# Patient Record
Sex: Female | Born: 1975 | Race: Asian | Hispanic: No | Marital: Married | Smoking: Never smoker
Health system: Southern US, Community
[De-identification: ages and names within clinical notes are randomized; demographics above are authoritative.]

---

## 2013-08-05 ENCOUNTER — Encounter (HOSPITAL_COMMUNITY): Payer: Self-pay | Admitting: Emergency Medicine

## 2013-08-05 ENCOUNTER — Emergency Department (HOSPITAL_COMMUNITY)
Admission: EM | Admit: 2013-08-05 | Discharge: 2013-08-05 | Disposition: A | Discharge status: Home / Self Care | Payer: Medicaid Other | Source: Home / Self Care | Attending: Family Medicine | Admitting: Family Medicine

## 2013-08-05 DIAGNOSIS — R109 Unspecified abdominal pain: Secondary | ICD-10-CM

## 2013-08-05 LAB — POCT URINALYSIS DIP (DEVICE)
BILIRUBIN URINE: NEGATIVE
Glucose, UA: NEGATIVE mg/dL
Ketones, ur: NEGATIVE mg/dL
LEUKOCYTES UA: NEGATIVE
Nitrite: NEGATIVE
Protein, ur: NEGATIVE mg/dL
SPECIFIC GRAVITY, URINE: 1.015 (ref 1.005–1.030)
Urobilinogen, UA: 0.2 mg/dL (ref 0.0–1.0)
pH: >=9 (ref 5.0–8.0)

## 2013-08-05 LAB — POCT PREGNANCY, URINE: PREG TEST UR: NEGATIVE

## 2013-08-05 NOTE — ED Provider Notes (Signed)
CSN: [id number]     Arrival date & time 08/05/13  1242 History   First MD Initiated Contact with Patient 08/05/13 1515     Chief Complaint  Patient presents with  . Abdominal Pain   (Consider location/radiation/quality/duration/timing/severity/associated sxs/prior Treatment) Patient is a 38 y.o. female presenting with abdominal pain. The history is provided by the patient.  Abdominal Pain Pain location:  LLQ Pain quality: sharp   Pain radiates to:  Does not radiate Pain severity:  Mild Duration:  4 days Chronicity:  New Context comment:  Has iud and menses irreg since. no n,v,d or fever. Associated symptoms: vaginal bleeding   Associated symptoms: no constipation, no diarrhea, no fever, no nausea, no vaginal discharge and no vomiting     History reviewed. No pertinent past medical history. History reviewed. No pertinent past surgical history. History reviewed. No pertinent family history. History  Substance Use Topics  . Smoking status: Never Smoker   . Smokeless tobacco: Not on file  . Alcohol Use: No   OB History   Grav Para Term Preterm Abortions TAB SAB Ect Mult Living                 Review of Systems  Constitutional: Negative.  Negative for fever.  Gastrointestinal: Positive for abdominal pain. Negative for nausea, vomiting, diarrhea and constipation.  Genitourinary: Positive for vaginal bleeding. Negative for vaginal discharge.    Allergies  Review of patient's allergies indicates no known allergies.  Home Medications  No current outpatient prescriptions on file. BP 115/72  Pulse 76  Temp(Src) 99.2 F (37.3 C) (Oral)  Resp 20  SpO2 100% Physical Exam  Nursing note and vitals reviewed. Constitutional: She is oriented to person, place, and time. She appears well-developed and well-nourished.  Abdominal: Soft. Bowel sounds are normal. She exhibits no distension and no mass. There is no hepatosplenomegaly. There is tenderness in the left lower quadrant. There  is no rebound, no guarding and no CVA tenderness.    Neurological: She is alert and oriented to person, place, and time.  Skin: Skin is warm and dry.    ED Course  Procedures (including critical care time) Labs Review Labs Reviewed  POCT URINALYSIS DIP (DEVICE) - Abnormal; Notable for the following:    Hgb urine dipstick LARGE (*)    All other components within normal limits  POCT PREGNANCY, URINE   Imaging Review No results found.  EKG Interpretation    Date/Time:    Ventricular Rate:    PR Interval:    QRS Duration:   QT Interval:    QTC Calculation:   R Axis:     Text Interpretation:              MDM      Linna HoffJames D Jinna Weinman, MD 08/05/13 [phone]

## 2013-08-05 NOTE — Discharge Instructions (Signed)
[facility] as desired.

## 2013-08-05 NOTE — ED Notes (Signed)
Pt  Reports  l  Sided  abd  Pain   X     4  Days     With  Frequent  Urination          Pt  States   Wants  Her UIUD  CHECKED   SHE  REPORTS  SHE HAD IT PUT IN IN [place]           SHE  AMBULATED  TO ROOM  WITH A  STEADY  FLUID  GAIT

## 2013-08-13 ENCOUNTER — Encounter (HOSPITAL_COMMUNITY): Payer: Self-pay

## 2013-08-13 ENCOUNTER — Inpatient Hospital Stay (HOSPITAL_COMMUNITY): Payer: Medicaid Other

## 2013-08-13 ENCOUNTER — Inpatient Hospital Stay (HOSPITAL_COMMUNITY)
Admission: AD | Admit: 2013-08-13 | Discharge: 2013-08-13 | Disposition: A | Discharge status: Home / Self Care | Payer: Medicaid Other | Source: Ambulatory Visit | Attending: Obstetrics & Gynecology | Admitting: Obstetrics & Gynecology

## 2013-08-13 DIAGNOSIS — N925 Other specified irregular menstruation: Secondary | ICD-10-CM

## 2013-08-13 DIAGNOSIS — Z30432 Encounter for removal of intrauterine contraceptive device: Secondary | ICD-10-CM

## 2013-08-13 DIAGNOSIS — T8332XA Displacement of intrauterine contraceptive device, initial encounter: Secondary | ICD-10-CM

## 2013-08-13 DIAGNOSIS — N949 Unspecified condition associated with female genital organs and menstrual cycle: Secondary | ICD-10-CM

## 2013-08-13 DIAGNOSIS — T8339XA Other mechanical complication of intrauterine contraceptive device, initial encounter: Secondary | ICD-10-CM | POA: Insufficient documentation

## 2013-08-13 DIAGNOSIS — R1032 Left lower quadrant pain: Secondary | ICD-10-CM | POA: Insufficient documentation

## 2013-08-13 DIAGNOSIS — T8389XA Other specified complication of genitourinary prosthetic devices, implants and grafts, initial encounter: Secondary | ICD-10-CM

## 2013-08-13 LAB — URINALYSIS, ROUTINE W REFLEX MICROSCOPIC
Bilirubin Urine: NEGATIVE
GLUCOSE, UA: NEGATIVE mg/dL
KETONES UR: NEGATIVE mg/dL
LEUKOCYTES UA: NEGATIVE
NITRITE: NEGATIVE
PH: 5.5 (ref 5.0–8.0)
Protein, ur: NEGATIVE mg/dL
SPECIFIC GRAVITY, URINE: 1.025 (ref 1.005–1.030)
Urobilinogen, UA: 0.2 mg/dL (ref 0.0–1.0)

## 2013-08-13 LAB — URINE MICROSCOPIC-ADD ON

## 2013-08-13 LAB — COMPREHENSIVE METABOLIC PANEL
ALK PHOS: 72 U/L (ref 39–117)
ALT: 10 U/L (ref 0–35)
AST: 15 U/L (ref 0–37)
Albumin: 4.1 g/dL (ref 3.5–5.2)
BILIRUBIN TOTAL: 0.3 mg/dL (ref 0.3–1.2)
BUN: 7 mg/dL (ref 6–23)
CO2: 26 mEq/L (ref 19–32)
Calcium: 9.1 mg/dL (ref 8.4–10.5)
Chloride: 102 mEq/L (ref 96–112)
Creatinine, Ser: 0.63 mg/dL (ref 0.50–1.10)
GFR calc non Af Amer: >90 mL/min (ref >90)
GLUCOSE: 129 mg/dL — AB (ref 70–99)
POTASSIUM: 4.5 meq/L (ref 3.7–5.3)
Sodium: 139 mEq/L (ref 137–147)
TOTAL PROTEIN: 7.6 g/dL (ref 6.0–8.3)

## 2013-08-13 LAB — CBC
HCT: 38.1 % (ref 36.0–46.0)
HEMOGLOBIN: 12.9 g/dL (ref 12.0–15.0)
MCH: 29.3 pg (ref 26.0–34.0)
MCHC: 33.9 g/dL (ref 30.0–36.0)
MCV: 86.4 fL (ref 78.0–100.0)
Platelets: 221 10*3/uL (ref 150–400)
RBC: 4.41 MIL/uL (ref 3.87–5.11)
RDW: 13.3 % (ref 11.5–15.5)
WBC: 10.7 10*3/uL — ABNORMAL HIGH (ref 4.0–10.5)

## 2013-08-13 LAB — WET PREP, GENITAL
Clue Cells Wet Prep HPF POC: NONE SEEN
Trich, Wet Prep: NONE SEEN
YEAST WET PREP: NONE SEEN

## 2013-08-13 LAB — POCT PREGNANCY, URINE: Preg Test, Ur: NEGATIVE

## 2013-08-13 MED ORDER — NORGESTIMATE-ETH ESTRADIOL 0.25-35 MG-MCG PO TABS: 1.0000 | ORAL_TABLET | Freq: Every day | ORAL | Status: DC

## 2013-08-13 NOTE — Discharge Instructions (Signed)
Ethinyl Estradiol; Norgestimate tablets What is this medicine? ETHINYL ESTRADIOL; NORGESTIMATE (ETH in il es tra DYE ole; nor JES ti mate) is an oral contraceptive. The products combine two types of female hormones, an estrogen and a progestin. They are used to prevent ovulation and pregnancy. Some products are also used to treat acne in females. This medicine may be used for other purposes; ask your health care provider or pharmacist if you have questions. COMMON BRAND NAME(S): Estarylla, MONO-LINYAH, MonoNessa, Ortho Tri-Cyclen Lo, Ortho Tri-Cyclen, Ortho-Cyclen, Previfem , Sprintec, Tri-Estarylla, TRI-LINYAH, Tri-Lo-Sprintec , Tri-Previfem , Tri-Sprintec , Trinessa What should I tell my health care provider before I take this medicine? They need to know if you have or ever had any of these conditions: -abnormal vaginal bleeding -blood vessel disease or blood clots -breast, cervical, endometrial, ovarian, liver, or uterine cancer -diabetes -gallbladder disease -heart disease or recent heart attack -high blood pressure -high cholesterol -kidney disease -liver disease -migraine headaches -stroke -systemic lupus erythematosus (SLE) -tobacco smoker -an unusual or allergic reaction to estrogens, progestins, other medicines, foods, dyes, or preservatives -pregnant or trying to get pregnant -breast-feeding How should I use this medicine? Take this medicine by mouth. To reduce nausea, this medicine may be taken with food. Follow the directions on the prescription label. Take this medicine at the same time each day and in the order directed on the package. Do not take your medicine more often than directed. Contact your pediatrician regarding the use of this medicine in children. Special care may be needed. This medicine has been used in female children who have started having menstrual periods. A patient package insert for the product will be given with each prescription and refill. Read this  sheet carefully each time. The sheet may change frequently. Overdosage: If you think you have taken too much of this medicine contact a poison control center or emergency room at once. NOTE: This medicine is only for you. Do not share this medicine with others. What if I miss a dose? If you miss a dose, refer to the patient information sheet you received with your medicine for direction. If you miss more than one pill, this medicine may not be as effective and you may need to use another form of birth control. What may interact with this medicine? -acetaminophen -antibiotics or medicines for infections, especially rifampin, rifabutin, rifapentine, and griseofulvin, and possibly penicillins or tetracyclines -aprepitant -ascorbic acid (vitamin C) -atorvastatin -barbiturate medicines, such as phenobarbital -bosentan -carbamazepine -caffeine -clofibrate -cyclosporine -dantrolene -doxercalciferol -felbamate -grapefruit juice -hydrocortisone -medicines for anxiety or sleeping problems, such as diazepam or temazepam -medicines for diabetes, including pioglitazone -mineral oil -modafinil -mycophenolate -nefazodone -oxcarbazepine -phenytoin -prednisolone -ritonavir or other medicines for HIV infection or AIDS -rosuvastatin -selegiline -soy isoflavones supplements -St. John's wort -tamoxifen or raloxifene -theophylline -thyroid hormones -topiramate -warfarin This list may not describe all possible interactions. Give your health care provider a list of all the medicines, herbs, non-prescription drugs, or dietary supplements you use. Also tell them if you smoke, drink alcohol, or use illegal drugs. Some items may interact with your medicine. What should I watch for while using this medicine? Visit your doctor or health care professional for regular checks on your progress. You will need a regular breast and pelvic exam and Pap smear while on this medicine. You should also discuss the  need for regular mammograms with your health care professional, and follow his or her guidelines for these tests. This medicine can make your body retain fluid, making your   fingers, hands, or ankles swell. Your blood pressure can go up. Contact your doctor or health care professional if you feel you are retaining fluid. Use an additional method of contraception during the first cycle that you take these tablets. If you have any reason to think you are pregnant, stop taking this medicine right away and contact your doctor or health care professional. If you are taking this medicine for hormone related problems, it may take several cycles of use to see improvement in your condition. Smoking increases the risk of getting a blood clot or having a stroke while you are taking birth control pills, especially if you are more than 37 years old. You are strongly advised not to smoke. This medicine can make you more sensitive to the sun. Keep out of the sun. If you cannot avoid being in the sun, wear protective clothing and use sunscreen. Do not use sun lamps or tanning beds/booths. If you wear contact lenses and notice visual changes, or if the lenses begin to feel uncomfortable, consult your eye care specialist. In some women, tenderness, swelling, or minor bleeding of the gums may occur. Notify your dentist if this happens. Brushing and flossing your teeth regularly may help limit this. See your dentist regularly and inform your dentist of the medicines you are taking. If you are going to have elective surgery, you may need to stop taking this medicine before the surgery. Consult your health care professional for advice. This medicine does not protect you against HIV infection (AIDS) or any other sexually transmitted diseases. What side effects may I notice from receiving this medicine? Side effects that you should report to your doctor or health care professional as soon as possible: -breast tissue changes or  discharge -changes in vaginal bleeding during your period or between your periods -chest pain -coughing up blood -dizziness or fainting spells -headaches or migraines -leg, arm or groin pain -severe or sudden headaches -stomach pain (severe) -sudden shortness of breath -sudden loss of coordination, especially on one side of the body -speech problems -symptoms of vaginal infection like itching, irritation or unusual discharge -tenderness in the upper abdomen -vomiting -weakness or numbness in the arms or legs, especially on one side of the body -yellowing of the eyes or skin Side effects that usually do not require medical attention (report to your doctor or health care professional if they continue or are bothersome): -breakthrough bleeding and spotting that continues beyond the 3 initial cycles of pills -breast tenderness -mood changes, anxiety, depression, frustration, anger, or emotional outbursts -increased sensitivity to sun or ultraviolet light -nausea -skin rash, acne, or brown spots on the skin -weight gain (slight) This list may not describe all possible side effects. Call your doctor for medical advice about side effects. You may report side effects to FDA at 1-800-FDA-1088. Where should I keep my medicine? Keep out of the reach of children. Store at room temperature between 15 and 30 degrees C (59 and 86 degrees F). Throw away any unused medicine after the expiration date. NOTE: This sheet is a summary. It may not cover all possible information. If you have questions about this medicine, talk to your doctor, pharmacist, or health care provider.  2014, Elsevier/Gold Standard. (2008-07-05 13:40:47)  

## 2013-08-13 NOTE — MAU Note (Signed)
Pt presents with complaints of lower abdominal pain and states that it is because of her IUD. She states she is new to the area and was told to come here and wants to have it removed

## 2013-08-13 NOTE — MAU Provider Note (Signed)
History     CSN: [id number]  Arrival date and time: 08/13/13 1134   None     Chief Complaint  Patient presents with  . IUD removal    HPI  Kelli Mercado is a 38 yo G2P2002 who presents to the MAU today for abdominal pain from her IUD.   Pt moved from [place] 5 months ago and has not established care here. In 2013, she had a mirena IUD placed.  Since then she has had irregular spotting. Otherwise she has had no complications with the IUD. The spotting has very much annoyed her from time to time. Sometimes lasts for 15-20 days at a time.    However, last week started having LLQ Pain. Has been constant since last week.  At its worst the pain was 8/10 and now it is more like a 5/10. Feels like she has to slouch forward in order to keep it from hurting. Now doesn't feel that bad. Initially had loose stools but now resolved.  Has no appetite. Never had pain like this before.   No fevers, chills, diarrhea, constipation, vomiting. Some dysuria. No hematuria, melena, hematochezia.     OB History   Grav Para Term Preterm Abortions TAB SAB Ect Mult Living   2 2              History reviewed. No pertinent past medical history.  History reviewed. No pertinent past surgical history.  History reviewed. No pertinent family history.  History  Substance Use Topics  . Smoking status: Never Smoker   . Smokeless tobacco: Not on file  . Alcohol Use: Not on file    Allergies: No Known Allergies  No prescriptions prior to admission    ROS Physical Exam   Blood pressure 101/86, pulse 89, temperature 98.2 F (36.8 C), resp. rate 18, height 5\' 1"  (1.549 m), weight 55.339 kg (122 lb), last menstrual period 08/13/2013.  Physical Exam  Constitutional: She is oriented to person, place, and time. She appears well-developed and well-nourished.  HENT:  Head: Normocephalic and atraumatic.  Eyes: Conjunctivae are normal.  Neck: Neck supple.  Cardiovascular: Normal rate, regular rhythm  and normal heart sounds.   Respiratory: Breath sounds normal.  GI: Soft. Bowel sounds are normal. She exhibits no distension. There is tenderness (mild with deep palpation of LLQ). There is no rebound and no guarding.  Genitourinary:  NEFG Vagina normal Cervix normal appearing with IUD strings out but somewhat long No vaginal bleeding or discharge Tender with palpation of the uterus. Feels to be retroverted.   Adnexa normal  Musculoskeletal: Normal range of motion.  Neurological: She is alert and oriented to person, place, and time.  Skin: Skin is warm and dry.  Psychiatric: She has a normal mood and affect.    MAU Course  Procedures  MDM - cbc, cmp    US:  FINDINGS: Uterus  Measurements: 9.1 x 4.7 x 4.6 cm. Retroverted and rotated. No myometrial abnormalities.  Endometrium  Thickness: 5.2 mm. There is an IUD abnormally positioned. The arms are in the left aspect of the lower uterine segment and the body is in the cervix.  Right ovary  Measurements: 2.8 x 2.0 x 2.4 cm. No cysts or masses.  Left ovary  Measurements: 3.6 x 2.0 x 2.5 cm. Slightly collapsed simple appearing cyst measuring 2.8 x 1.4 x 2.3 cm.  Other findings  No free fluid.  IMPRESSION: 1. Malpositioned IUD as described above. 2. No myometrial abnormalities and normal endometrial thickness.  3. Normal ovaries.   Assessment and Plan   38 yo G2P2002 here for LLQ abd pain.   - pain mild on exam. CBC and CMP unremarkable - on exam IUD strings in place but US showing IUD in the cervix with arms in the left side. Position of IUD consistent with pt's pain symptoms and location and likely cause.  - after thorough consent, IUD removal performed as below due to malposition.  - started pt on OCPs as pt adamant she doesn't want more children - note sent to GYN clinic for establishment of GYN care and longer discussion about birth control as pt wants to be seen here - return precautions discussed including  worsening of her symptoms or change from baseline.    IUD Removal  Patient was in the dorsal lithotomy position, normal external genitalia was noted.  A speculum was placed in the patient's vagina, normal discharge was noted, no lesions. The multiparous cervix was visualized, no lesions, no abnormal discharge.  The strings of the IUD were grasped and pulled using ring forceps. Patient tolerated the procedure well.      Quanell Loughney L 08/13/2013, 12:01 PM

## 2013-08-14 LAB — URINE CULTURE

## 2013-08-14 LAB — GC/CHLAMYDIA PROBE AMP
CT Probe RNA: NEGATIVE
GC Probe RNA: NEGATIVE

## 2013-09-22 ENCOUNTER — Encounter: Payer: Self-pay | Admitting: Family Medicine

## 2013-09-22 ENCOUNTER — Encounter (HOSPITAL_COMMUNITY): Payer: Self-pay | Admitting: Emergency Medicine

## 2013-09-22 ENCOUNTER — Ambulatory Visit (INDEPENDENT_AMBULATORY_CARE_PROVIDER_SITE_OTHER): Payer: Medicaid Other | Admitting: Family Medicine

## 2013-09-22 VITALS — BP 142/83 | HR 71 | Temp 97.4°F | Ht 61.0 in | Wt 125.0 lb

## 2013-09-22 DIAGNOSIS — Z01812 Encounter for preprocedural laboratory examination: Secondary | ICD-10-CM

## 2013-09-22 DIAGNOSIS — Z309 Encounter for contraceptive management, unspecified: Secondary | ICD-10-CM

## 2013-09-22 DIAGNOSIS — Z30017 Encounter for initial prescription of implantable subdermal contraceptive: Secondary | ICD-10-CM

## 2013-09-22 DIAGNOSIS — Z975 Presence of (intrauterine) contraceptive device: Secondary | ICD-10-CM | POA: Insufficient documentation

## 2013-09-22 LAB — POCT PREGNANCY, URINE
Preg Test, Ur: NEGATIVE
Preg Test, Ur: NEGATIVE

## 2013-09-22 MED ORDER — ETONOGESTREL 68 MG ~~LOC~~ IMPL
68.0000 mg | DRUG_IMPLANT | Freq: Once | SUBCUTANEOUS | Status: AC
  Administered 2013-09-22: 68 mg via SUBCUTANEOUS

## 2013-09-22 MED ORDER — ETONOGESTREL 68 MG ~~LOC~~ IMPL: 1.0000 | DRUG_IMPLANT | Freq: Once | SUBCUTANEOUS | Status: DC

## 2013-09-22 NOTE — Progress Notes (Signed)
Pt with neg Urine Pregnancy test. Recently d/c'd mirena for abdominal pain that has since resolved. Pt now wants nexplanon. Tawana ScaleMichael Ryan Harly Pipkins, MD OB Fellow   Procedure: Insertion of Nexplanon Consent: The risks and benefits of the procedure were discussed with the patient. The alternatives were explained to the patient. The disadvantages to not doing the procedure were discussed with the patent along with the disadvantages of the medication.  The written consent form has been signed and placed in the patient's medical record The patient voiced understanding of the procedure and agreed to proceed. Indication: Contraception Physicians: Dr. Jolyn LentMichael Moreen Piggott Description in detail: A timeout was completed before the start of the procedure - the site was  verified and documented in the chart. HCG was negative prior to starting. The inner L arm was cleansed with alcohol and anesthetized with 3cc Lidocaine.  The Nexplanon introducer was placed in the appropriate location and inserted in the usual fashion. The patient tolerated the procedure well, without any S/S of vasovagal responses. EBL: Minimal, <501ml Complications: None Instructions to patient: Return to clinic vs. ER, depending on severity, with fevers/chills/excessive bleeding from surgical site.  Keep pressure dressing in place for 24 hours.  Expect mild soreness for one day. Call clinic with other questions. Allow approximately two weeks before having unprotected intercourse; the Nexplanon does not protect against STIs. Replace in 3 years. Follow up plan: Return to clinic for follow up as needed.

## 2013-09-22 NOTE — Patient Instructions (Signed)
Etonogestrel implant What is this medicine? ETONOGESTREL (et oh noe JES trel) is a contraceptive (birth control) device. It is used to prevent pregnancy. It can be used for up to 3 years. This medicine may be used for other purposes; ask your health care provider or pharmacist if you have questions. COMMON BRAND NAME(S): Implanon, Nexplanon  What should I tell my health care provider before I take this medicine? They need to know if you have any of these conditions: -abnormal vaginal bleeding -blood vessel disease or blood clots -cancer of the breast, cervix, or liver -depression -diabetes -gallbladder disease -headaches -heart disease or recent heart attack -high blood pressure -high cholesterol -kidney disease -liver disease -renal disease -seizures -tobacco smoker -an unusual or allergic reaction to etonogestrel, other hormones, anesthetics or antiseptics, medicines, foods, dyes, or preservatives -pregnant or trying to get pregnant -breast-feeding How should I use this medicine? This device is inserted just under the skin on the inner side of your upper arm by a health care professional. Talk to your pediatrician regarding the use of this medicine in children. Special care may be needed. Overdosage: If you think you've taken too much of this medicine contact a poison control center or emergency room at once. Overdosage: If you think you have taken too much of this medicine contact a poison control center or emergency room at once. NOTE: This medicine is only for you. Do not share this medicine with others. What if I miss a dose? This does not apply. What may interact with this medicine? Do not take this medicine with any of the following medications: -amprenavir -bosentan -fosamprenavir This medicine may also interact with the following medications: -barbiturate medicines for inducing sleep or treating seizures -certain medicines for fungal infections like ketoconazole and  itraconazole -griseofulvin -medicines to treat seizures like carbamazepine, felbamate, oxcarbazepine, phenytoin, topiramate -modafinil -phenylbutazone -rifampin -some medicines to treat HIV infection like atazanavir, indinavir, lopinavir, nelfinavir, tipranavir, ritonavir -St. John's wort This list may not describe all possible interactions. Give your health care provider a list of all the medicines, herbs, non-prescription drugs, or dietary supplements you use. Also tell them if you smoke, drink alcohol, or use illegal drugs. Some items may interact with your medicine. What should I watch for while using this medicine? This product does not protect you against HIV infection (AIDS) or other sexually transmitted diseases. You should be able to feel the implant by pressing your fingertips over the skin where it was inserted. Tell your doctor if you cannot feel the implant. What side effects may I notice from receiving this medicine? Side effects that you should report to your doctor or health care professional as soon as possible: -allergic reactions like skin rash, itching or hives, swelling of the face, lips, or tongue -breast lumps -changes in vision -confusion, trouble speaking or understanding -dark urine -depressed mood -general ill feeling or flu-like symptoms -light-colored stools -loss of appetite, nausea -right upper belly pain -severe headaches -severe pain, swelling, or tenderness in the abdomen -shortness of breath, chest pain, swelling in a leg -signs of pregnancy -sudden numbness or weakness of the face, arm or leg -trouble walking, dizziness, loss of balance or coordination -unusual vaginal bleeding, discharge -unusually weak or tired -yellowing of the eyes or skin Side effects that usually do not require medical attention (Report these to your doctor or health care professional if they continue or are bothersome.): -acne -breast pain -changes in  weight -cough -fever or chills -headache -irregular menstrual bleeding -itching, burning,   and vaginal discharge -pain or difficulty passing urine -sore throat This list may not describe all possible side effects. Call your doctor for medical advice about side effects. You may report side effects to FDA at 1-800-FDA-1088. Where should I keep my medicine? This drug is given in a hospital or clinic and will not be stored at home. NOTE: This sheet is a summary. It may not cover all possible information. If you have questions about this medicine, talk to your doctor, pharmacist, or health care provider.  2014, Elsevier/Gold Standard. (2012-01-25 15:37:45)  

## 2013-10-23 ENCOUNTER — Encounter: Payer: Self-pay | Admitting: *Deleted

## 2013-11-10 ENCOUNTER — Ambulatory Visit: Payer: Medicaid Other | Admitting: Family Medicine

## 2014-06-04 ENCOUNTER — Encounter: Payer: Self-pay | Admitting: Family Medicine

## 2015-06-11 ENCOUNTER — Ambulatory Visit (HOSPITAL_BASED_OUTPATIENT_CLINIC_OR_DEPARTMENT_OTHER)
Admission: RE | Admit: 2015-06-11 | Discharge: 2015-06-11 | Disposition: A | Discharge status: Home / Self Care | Payer: 59 | Source: Ambulatory Visit | Attending: Family Medicine | Admitting: Family Medicine

## 2015-06-11 ENCOUNTER — Other Ambulatory Visit: Payer: Self-pay | Admitting: Family Medicine

## 2015-06-11 ENCOUNTER — Ambulatory Visit (INDEPENDENT_AMBULATORY_CARE_PROVIDER_SITE_OTHER): Payer: 59

## 2015-06-11 ENCOUNTER — Ambulatory Visit (INDEPENDENT_AMBULATORY_CARE_PROVIDER_SITE_OTHER): Payer: 59 | Admitting: Family Medicine

## 2015-06-11 VITALS — BP 132/78 | HR 81 | Temp 97.8°F | Resp 16 | Ht 62.0 in | Wt 126.6 lb

## 2015-06-11 DIAGNOSIS — R1012 Left upper quadrant pain: Secondary | ICD-10-CM

## 2015-06-11 DIAGNOSIS — K7689 Other specified diseases of liver: Secondary | ICD-10-CM | POA: Diagnosis not present

## 2015-06-11 DIAGNOSIS — R1032 Left lower quadrant pain: Secondary | ICD-10-CM

## 2015-06-11 DIAGNOSIS — R109 Unspecified abdominal pain: Secondary | ICD-10-CM

## 2015-06-11 DIAGNOSIS — M4126 Other idiopathic scoliosis, lumbar region: Secondary | ICD-10-CM | POA: Diagnosis not present

## 2015-06-11 DIAGNOSIS — E049 Nontoxic goiter, unspecified: Secondary | ICD-10-CM | POA: Diagnosis not present

## 2015-06-11 DIAGNOSIS — M62830 Muscle spasm of back: Secondary | ICD-10-CM

## 2015-06-11 DIAGNOSIS — R195 Other fecal abnormalities: Secondary | ICD-10-CM

## 2015-06-11 DIAGNOSIS — M419 Scoliosis, unspecified: Secondary | ICD-10-CM

## 2015-06-11 LAB — COMPREHENSIVE METABOLIC PANEL
ALBUMIN: 4.4 g/dL (ref 3.6–5.1)
ALT: 10 U/L (ref 6–29)
AST: 15 U/L (ref 10–30)
Alkaline Phosphatase: 65 U/L (ref 33–115)
BUN: 5 mg/dL — AB (ref 7–25)
CO2: 26 mmol/L (ref 20–31)
CREATININE: 0.63 mg/dL (ref 0.50–1.10)
Calcium: 9.3 mg/dL (ref 8.6–10.2)
Chloride: 105 mmol/L (ref 98–110)
Glucose, Bld: 88 mg/dL (ref 65–99)
POTASSIUM: 4.1 mmol/L (ref 3.5–5.3)
SODIUM: 139 mmol/L (ref 135–146)
TOTAL PROTEIN: 7.4 g/dL (ref 6.1–8.1)
Total Bilirubin: 0.4 mg/dL (ref 0.2–1.2)

## 2015-06-11 LAB — POCT URINALYSIS DIP (MANUAL ENTRY)
BILIRUBIN UA: NEGATIVE
BILIRUBIN UA: NEGATIVE
Glucose, UA: NEGATIVE
LEUKOCYTES UA: NEGATIVE
NITRITE UA: NEGATIVE
PH UA: 7
Protein Ur, POC: NEGATIVE
Spec Grav, UA: 1.01
Urobilinogen, UA: 0.2

## 2015-06-11 LAB — POCT WET + KOH PREP
Trich by wet prep: ABSENT
YEAST BY KOH: ABSENT
YEAST BY WET PREP: ABSENT

## 2015-06-11 LAB — POCT CBC
Granulocyte percent: 69.5 %G (ref 37–80)
HEMATOCRIT: 38.8 % (ref 37.7–47.9)
HEMOGLOBIN: 13.4 g/dL (ref 12.2–16.2)
Lymph, poc: 2.3 (ref 0.6–3.4)
MCH, POC: 29.1 pg (ref 27–31.2)
MCHC: 34.4 g/dL (ref 31.8–35.4)
MCV: 84.7 fL (ref 80–97)
MID (cbc): 0.6 (ref 0–0.9)
MPV: 8.7 fL (ref 0–99.8)
POC GRANULOCYTE: 6.7 (ref 2–6.9)
POC LYMPH PERCENT: 24.1 %L (ref 10–50)
POC MID %: 6.4 % (ref 0–12)
Platelet Count, POC: 212 10*3/uL (ref 142–424)
RBC: 4.58 M/uL (ref 4.04–5.48)
RDW, POC: 13.6 %
WBC: 9.6 10*3/uL (ref 4.6–10.2)

## 2015-06-11 LAB — HEMOCCULT GUIAC POC 1CARD (OFFICE): Fecal Occult Blood, POC: POSITIVE — AB

## 2015-06-11 LAB — POC MICROSCOPIC URINALYSIS (UMFC): Mucus: ABSENT

## 2015-06-11 LAB — THYROID PANEL WITH TSH
Free Thyroxine Index: 2.2 (ref 1.4–3.8)
T3 Uptake: 28 % (ref 22–35)
T4, Total: 8 ug/dL (ref 4.5–12.0)
TSH: 1.723 u[IU]/mL (ref 0.350–4.500)

## 2015-06-11 LAB — POCT URINE PREGNANCY: Preg Test, Ur: NEGATIVE

## 2015-06-11 MED ORDER — CYCLOBENZAPRINE HCL 10 MG PO TABS: 10.0000 mg | ORAL_TABLET | Freq: Three times a day (TID) | ORAL | Status: AC | PRN

## 2015-06-11 MED ORDER — IOHEXOL 300 MG/ML  SOLN: 100.0000 mL | Freq: Once | INTRAMUSCULAR | Status: DC | PRN

## 2015-06-11 NOTE — Patient Instructions (Addendum)
GO OVER [facility] [address], [place], [place] [id number]. GO INTO THE ER TELL THEM THAT YOU ARE THERE FOR CT SCAN AND TO SEND YOU TO IMAGING.  It is possible this is a very mild diverticulisis vs a severe muscle spasm in your back.

## 2015-06-11 NOTE — Progress Notes (Signed)
Subjective:  This chart was scribed for Norberto Sorenson, MD by Broadus John, Medical Scribe. This patient was seen in Room 1 and the patient's care was started at 9:47 AM.   Patient ID: Kelli Mercado, female    DOB: November 16, 1975, 39 y.o.   MRN: [id number]  Chief Complaint  Patient presents with  . Back Pain    HPI HPI Comments: Kelli Mercado is a 39 y.o. female who presents to [facility] complaining tenderness in the back, onset 2 weeks ago. Pt indicates that the pain is present in her lower abdomen, left flank, left lateral ribs, and entire low lumbar spine when in a bending position. She reports that sitting, and walking exacerbate the worsening pain. Pt also notes symptoms of bloating and distension of the abdomen. Pt states that she took Ibuprofen for the pain, last dosage yesterday, however she finds minimal relief with it. She denies bowel or urinary symptoms, tarry stool, constipation, orchange in appetite. Pt also reports tenderness on the sides of her neck, in the area behind her ears. Pt reports a history of constipation (2 months ago) that has now resolved after visiting a doctor and being prescribed a medication.  Pt states that her last Pap smear was 2 years ago, and notes that that she does have a birth control implant in her arm.     Patient Active Problem List   Diagnosis Date Noted  . Nexplanon in place 09/22/2013   History reviewed. No pertinent past medical history. History reviewed. No pertinent past surgical history. No Known Allergies Prior to Admission medications   Medication Sig Start Date End Date Taking? Authorizing Provider  ibuprofen (ADVIL,MOTRIN) 800 MG tablet Take 800 mg by mouth every 8 (eight) hours as needed.   Yes Historical Provider, MD   Social History   Social History  . Marital Status: Married    Spouse Name: N/A  . Number of Children: N/A  . Years of Education: N/A   Occupational History  . Not on file.    Social History Main Topics  . Smoking status: Never Smoker   . Smokeless tobacco: Never Used  . Alcohol Use: No  . Drug Use: No  . Sexual Activity: Yes   Other Topics Concern  . Not on file   Social History Narrative   ** Merged History Encounter **        Review of Systems  Constitutional: Negative for appetite change.  Gastrointestinal: Positive for abdominal pain. Negative for diarrhea, constipation and blood in stool.  Genitourinary: Positive for flank pain. Negative for dysuria and frequency.  Musculoskeletal: Positive for back pain and neck pain.      Objective:   Physical Exam  Constitutional: She is oriented to person, place, and time. She appears well-developed and well-nourished. No distress.  HENT:  Head: Normocephalic and atraumatic.  Eyes: EOM are normal. Pupils are equal, round, and reactive to light.  Neck: Neck supple.  Question of left lobe fullness in the thyroid.  Cardiovascular: Normal rate.   Pulmonary/Chest: Effort normal.  Abdominal: She exhibits no distension. There is tenderness (LLQ). There is CVA tenderness. There is no rebound and no guarding.  Hypoactive bowel sounds.   Genitourinary: Rectum normal and vagina normal. There is no rash, tenderness, lesion or injury on the right labia. There is no rash, tenderness, lesion or injury on the left labia. Uterus is not deviated, not enlarged, not fixed and not tender. Cervix exhibits no motion tenderness, no  discharge and no friability. Right adnexum displays no mass, no tenderness and no fullness. Left adnexum displays no mass, no tenderness and no fullness.  Musculoskeletal: She exhibits tenderness.  Severe pain with hip flexion. Tenderness to palpation on the left lumbar paraspinal muscles. No significant pain on the paraspinous process.  Neurological: She is alert and oriented to person, place, and time. No cranial nerve deficit.  Reflex Scores:      Patellar reflexes are 2+ on the right side and  2+ on the left side.      Achilles reflexes are 2+ on the right side and 2+ on the left side. 4/5 weakness in left hip flexion.   Skin: Skin is warm and dry.  Psychiatric: She has a normal mood and affect. Her behavior is normal.  Nursing note and vitals reviewed.   [facility] (PRIMARY) x-ray report read by Dr. Norberto Sorenson, MD: Abdomen- normal chest,  bowel gas seen on left side when pt's pain is located, moderate stool burden, no abnormal bowel gas pattern, small amount of lumbar scoliosis.    BP 132/78 mmHg  Pulse 81  Temp(Src) 97.8 F (36.6 C) (Oral)  Resp 16  Ht  (1.575 m)  Wt 126 lb 9.6 oz (57.425 kg)  BMI 23.15 kg/m2  SpO2 99%     Assessment & Plan:   1. Acute left flank pain   2. Thyroid enlargement   3. Abdominal pain, left lower quadrant   4. Occult blood positive stool   5. Spasm of lumbar paraspinous muscle   6. Lumbar scoliosis     Orders Placed This Encounter  Procedures  . Urine culture  . DG Abd Acute W/Chest    Order Specific Question:  Reason for exam:    Answer:  left flank, left lower quandrant pain x 2 wks, worsening.    Order Specific Question:  Is the patient pregnant?    Answer:  No    Order Specific Question:  Preferred imaging location?    Answer:  External  . Comprehensive metabolic panel  . Thyroid Panel With TSH  . POCT CBC  . POCT occult blood stool  . POCT urinalysis dipstick  . POCT urine pregnancy  . POCT Microscopic Urinalysis ([facility])  . POCT Wet + KOH Prep    Meds ordered this encounter  Medications  . ibuprofen (ADVIL,MOTRIN) 800 MG tablet    Sig: Take 800 mg by mouth every 8 (eight) hours as needed.  . cyclobenzaprine (FLEXERIL) 10 MG tablet    Sig: Take 1 tablet (10 mg total) by mouth 3 (three) times daily as needed for muscle spasms. Take before bed    Dispense:  30 tablet    Refill:  0    I personally performed the services described in this documentation, which was scribed in my presence. The recorded information has  been reviewed and considered, and addended by me as needed.  Norberto Sorenson, MD MPH    By signing my name below, I, Rawaa Al Rifaie, attest that this documentation has been prepared under the direction and in the presence of Norberto Sorenson, MD.  Broadus John, Medical Scribe. 06/11/2015.  10:02 AM.   Results for orders placed or performed in visit on 06/11/15  Urine culture  Result Value Ref Range   Colony Count NO GROWTH    Organism ID, Bacteria NO GROWTH   Comprehensive metabolic panel  Result Value Ref Range   Sodium 139 135 - 146 mmol/L   Potassium 4.1  3.5 - 5.3 mmol/L   Chloride 105 98 - 110 mmol/L   CO2 26 20 - 31 mmol/L   Glucose, Bld 88 65 - 99 mg/dL   BUN 5 (L) 7 - 25 mg/dL   Creat 4.090.63 8.110.50 - 9.141.10 mg/dL   Total Bilirubin 0.4 0.2 - 1.2 mg/dL   Alkaline Phosphatase 65 33 - 115 U/L   AST 15 10 - 30 U/L   ALT 10 6 - 29 U/L   Total Protein 7.4 6.1 - 8.1 g/dL   Albumin 4.4 3.6 - 5.1 g/dL   Calcium 9.3 8.6 - 78.210.2 mg/dL  Thyroid Panel With TSH  Result Value Ref Range   T4, Total 8.0 4.5 - 12.0 ug/dL   T3 Uptake 28 22 - 35 %   Free Thyroxine Index 2.2 1.4 - 3.8   TSH 1.723 0.350 - 4.500 uIU/mL  POCT CBC  Result Value Ref Range   WBC 9.6 4.6 - 10.2 K/uL   Lymph, poc 2.3 0.6 - 3.4   POC LYMPH PERCENT 24.1 10 - 50 %L   MID (cbc) 0.6 0 - 0.9   POC MID % 6.4 0 - 12 %M   POC Granulocyte 6.7 2 - 6.9   Granulocyte percent 69.5 37 - 80 %G   RBC 4.58 4.04 - 5.48 M/uL   Hemoglobin 13.4 12.2 - 16.2 g/dL   HCT, POC 95.638.8 21.337.7 - 47.9 %   MCV 84.7 80 - 97 fL   MCH, POC 29.1 27 - 31.2 pg   MCHC 34.4 31.8 - 35.4 g/dL   RDW, POC 08.613.6 %   Platelet Count, POC 212 142 - 424 K/uL   MPV 8.7 0 - 99.8 fL  POCT occult blood stool  Result Value Ref Range   Fecal Occult Blood, POC Positive (A) Negative   Card #1 Date     Card #2 Fecal Occult Blod, POC     Card #2 Date     Card #3 Fecal Occult Blood, POC     Card #3 Date    POCT urinalysis dipstick  Result Value Ref Range   Color, UA yellow  yellow   Clarity, UA clear clear   Glucose, UA negative negative   Bilirubin, UA negative negative   Ketones, POC UA negative negative   Spec Grav, UA 1.010    Blood, UA trace-lysed (A) negative   pH, UA 7.0    Protein Ur, POC negative negative   Urobilinogen, UA 0.2    Nitrite, UA Negative Negative   Leukocytes, UA Negative Negative  POCT urine pregnancy  Result Value Ref Range   Preg Test, Ur Negative Negative  POCT Microscopic Urinalysis ([facility])  Result Value Ref Range   WBC,UR,HPF,POC None None WBC/hpf   RBC,UR,HPF,POC None None RBC/hpf   Bacteria Few (A) None, Too numerous to count   Mucus Absent Absent   Epithelial Cells, UR Per Microscopy Few (A) None, Too numerous to count cells/hpf  POCT Wet + KOH Prep  Result Value Ref Range   Yeast by KOH Absent Present, Absent   Yeast by wet prep Absent Present, Absent   WBC by wet prep Few None, Few, Too numerous to count   Clue Cells Wet Prep HPF POC Few (A) None, Too numerous to count   Trich by wet prep Absent Present, Absent   Bacteria Wet Prep HPF POC Many (A) None, Few, Too numerous to count   Epithelial Cells By [employer] Financial[employer]et Pref ([facility]) Moderate (A) None, Few, Too  numerous to count   RBC,UR,HPF,POC Few (A) None RBC/hpf

## 2015-06-12 LAB — URINE CULTURE
Colony Count: NO GROWTH
Organism ID, Bacteria: NO GROWTH

## 2015-06-13 ENCOUNTER — Other Ambulatory Visit: Payer: Self-pay | Admitting: Family Medicine

## 2015-06-13 ENCOUNTER — Telehealth: Payer: Self-pay | Admitting: Family Medicine

## 2015-06-13 DIAGNOSIS — M62830 Muscle spasm of back: Secondary | ICD-10-CM

## 2015-06-13 MED ORDER — METHOCARBAMOL 500 MG PO TABS: 500.0000 mg | ORAL_TABLET | Freq: Four times a day (QID) | ORAL | Status: AC

## 2015-06-13 MED ORDER — DICLOFENAC SODIUM 75 MG PO TBEC: 75.0000 mg | DELAYED_RELEASE_TABLET | Freq: Two times a day (BID) | ORAL | Status: AC

## 2015-06-13 NOTE — Telephone Encounter (Signed)
Called pt and discussed nml Ct - suspect her cause is MSK. Pt still w/ sig pain. Is taking the flexeril but not helping to much and really sleepy. Ibuprofen works more (but not much). Has just been resting.  Advised starting heat to the low back, start stretching.  Try diclofenac and methocarbamol - perhaps with less sedation she can be more active which should help back. Placed PT referral. Pt advised to RTC on Sat to see me if sxs cont. Pt understands and agrees to plan.

## 2015-06-13 NOTE — Telephone Encounter (Signed)
Patient called requesting CT results. °Please advise. °

## 2015-08-26 ENCOUNTER — Ambulatory Visit: Payer: Medicaid Other | Attending: Internal Medicine | Admitting: Physical Therapy

## 2017-06-11 IMAGING — CT CT ABD-PELV W/O CM
2 of 4 series · 16 of 46 positions shown, 18 images · non-contrast
Comparison: None.

CLINICAL DATA: Left lower quadrant pain 15 days with occult blood
positive stool. Evaluate for diverticulitis.

EXAM:
CT ABDOMEN AND PELVIS WITHOUT CONTRAST
TECHNIQUE: Multidetector CT imaging of the abdomen and pelvis was performed
following the standard protocol without IV contrast. Unable to
obtain IV access.

[Series 2: abd/pelvis 5.0 b31f · axial · 0.65mm/px · z∈[-677,-277]mm · 13 of 88 slices shown, 15 images]
[im 4/88  soft-tissue]
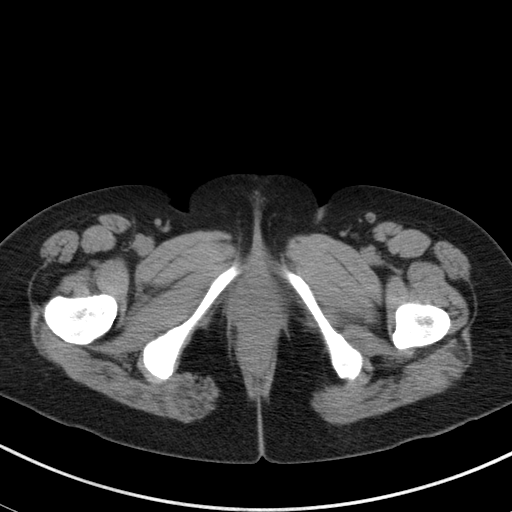
[im 4/88  bone]
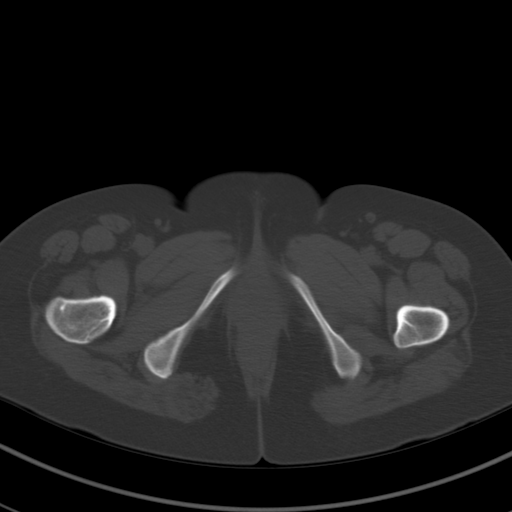
[im 11/88  soft-tissue]
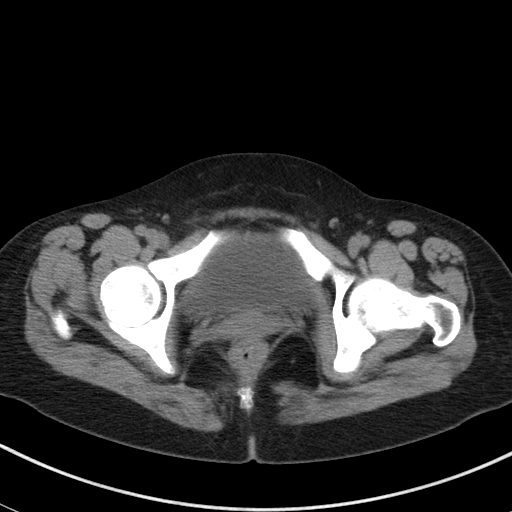
[im 18/88  soft-tissue]
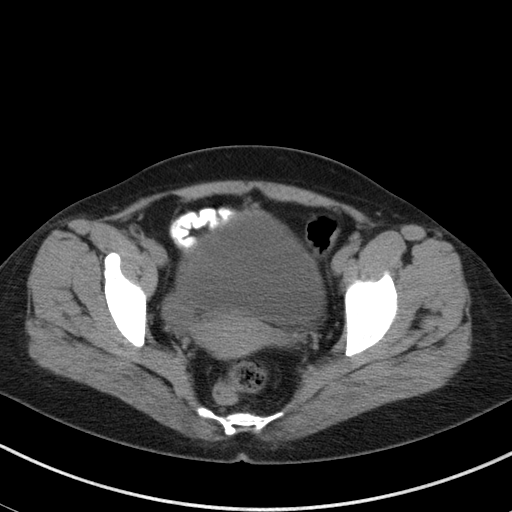
[im 25/88  soft-tissue]
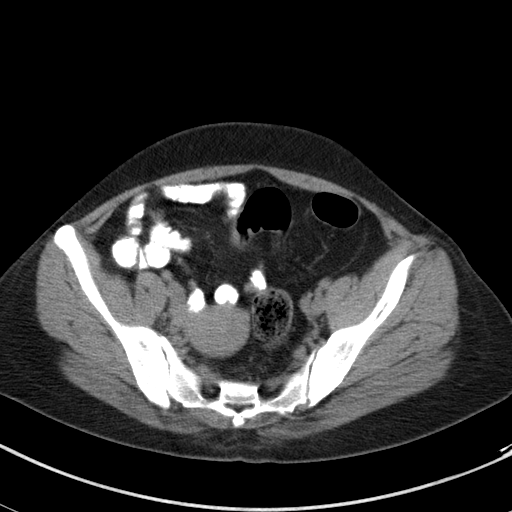
[im 32/88  soft-tissue]
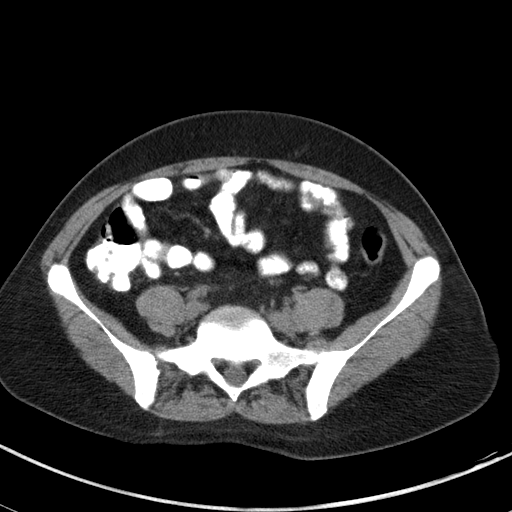
[im 39/88  soft-tissue]
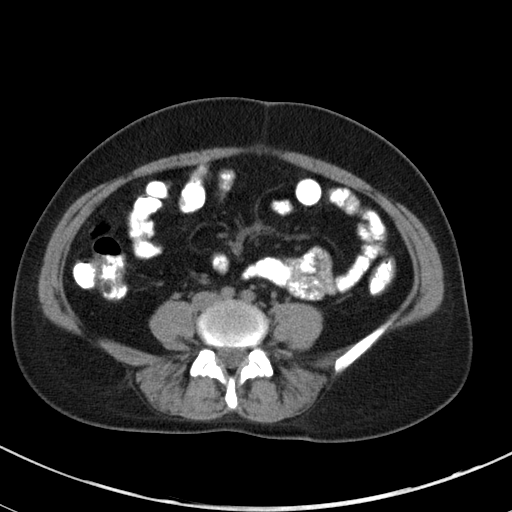
[im 46/88  soft-tissue]
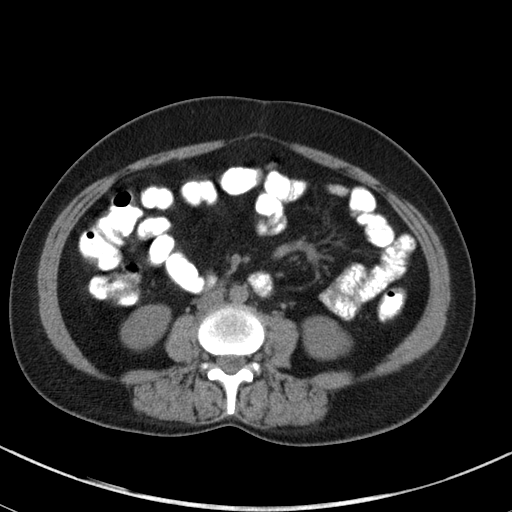
[im 49/88  soft-tissue]
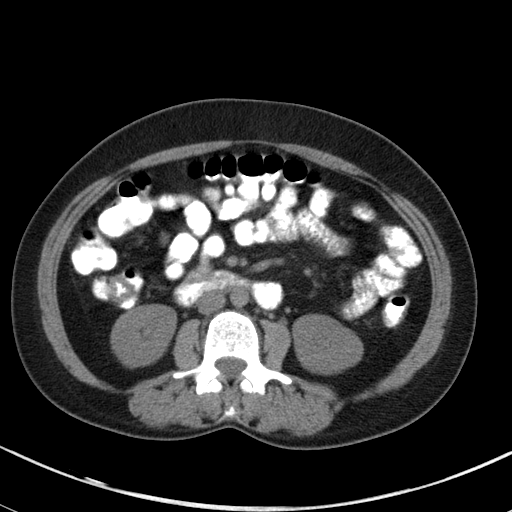
[im 56/88  soft-tissue]
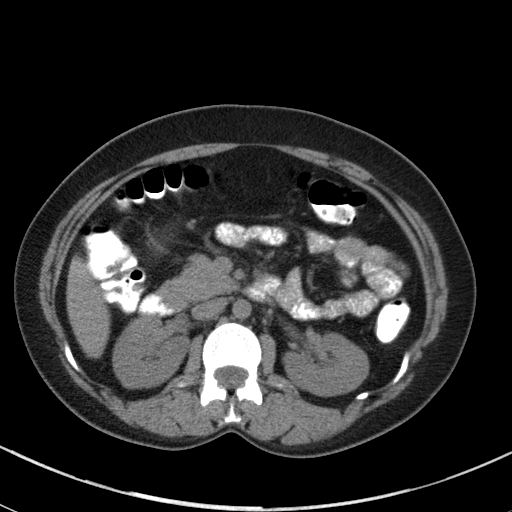
[im 56/88  bone]
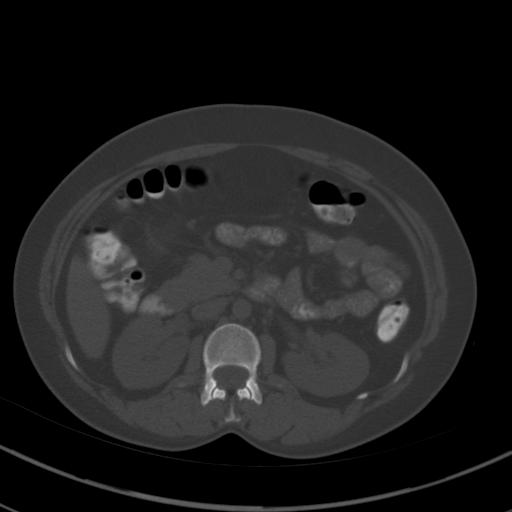
[im 63/88  soft-tissue]
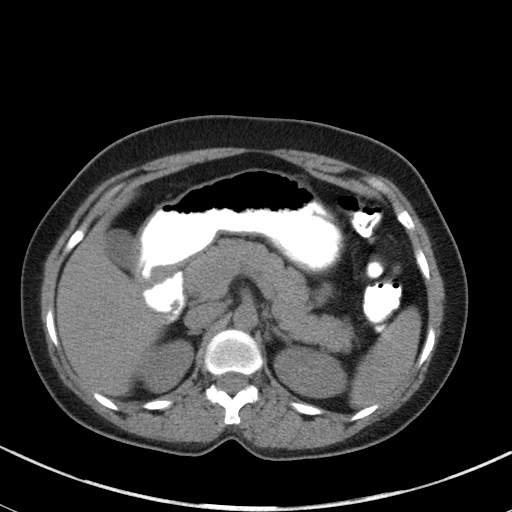
[im 70/88  soft-tissue]
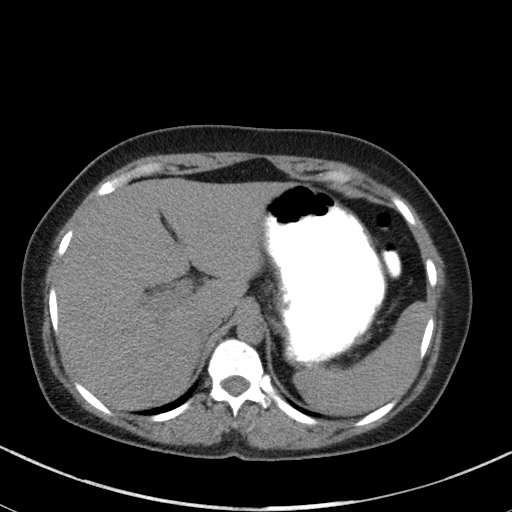
[im 77/88  soft-tissue]
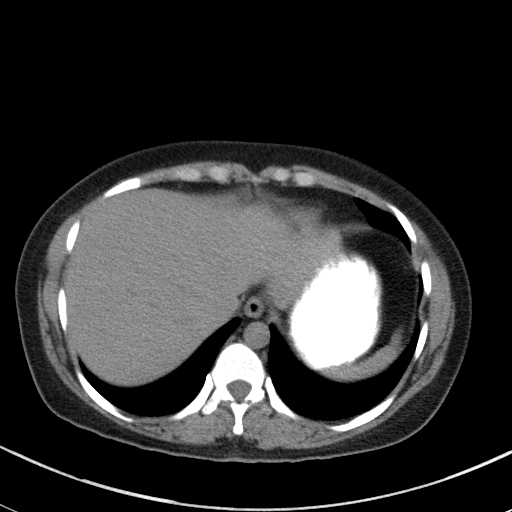
[im 84/88  soft-tissue]
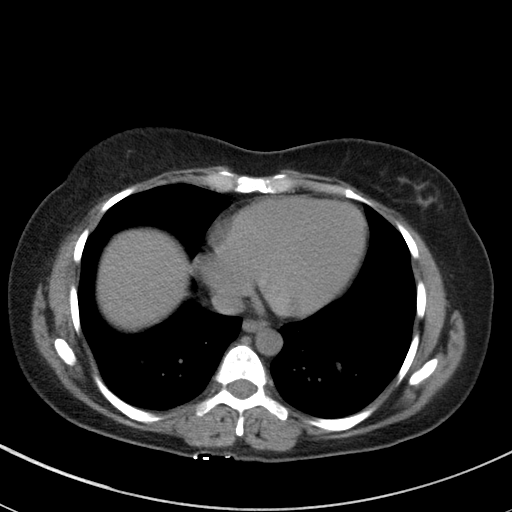

[Series 5: abd/pelvis 3.0 coronal · coronal · 0.74mm/px · 3 of 80 slices shown]
[im 27/80  soft-tissue]
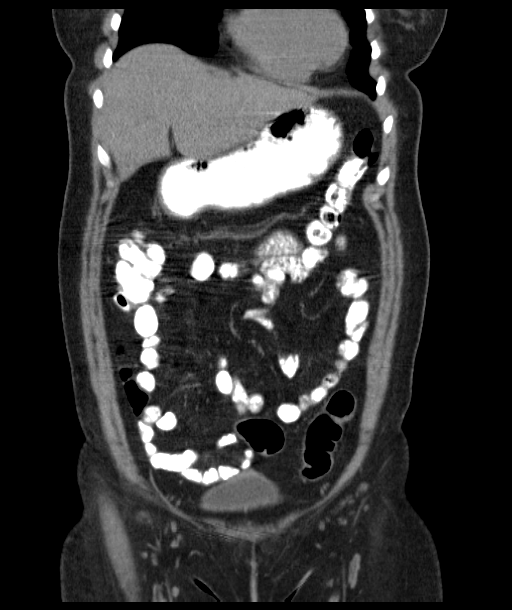
[im 36/80  soft-tissue]
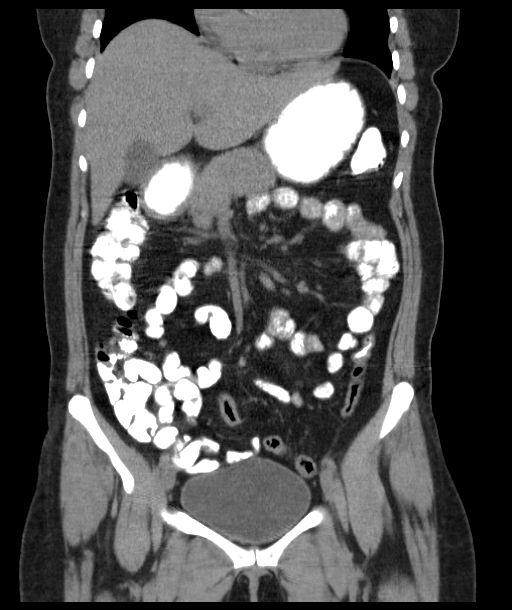
[im 44/80  soft-tissue]
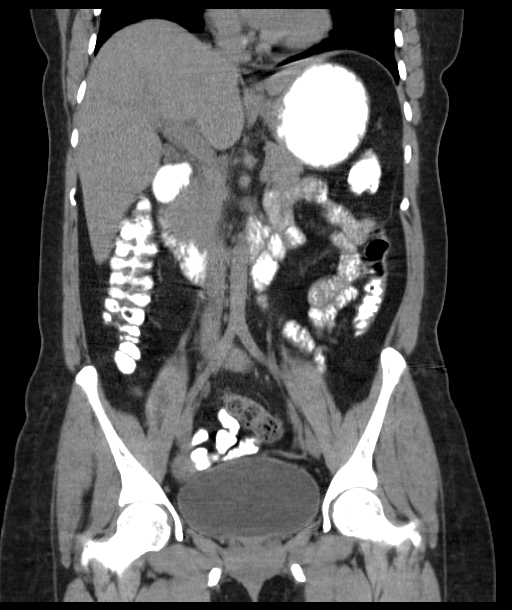

[16 of 46 positions shown; findings below may reference images not displayed]

FINDINGS: Lung bases are within normal.

Abdominal images demonstrate a sub cm hypodensity over the dome of
the right lobe of the liver likely a small cyst or hemangioma. The
spleen, pancreas, gallbladder and adrenal glands are normal. Stomach
is within normal. Appendix is normal.

Kidneys are normal in size without nephrolithiasis. Subtle
prominence of the upper pole collecting system of the right kidney.
No perinephric inflammation or fluid. Ureters are normal.

Vascular structures are within normal. Mesenteries within normal.
There is no free fluid or free peritoneal air.

Colon is within normal.  Small bowel is normal.

Pelvic images demonstrate a normal bladder, uterus, ovaries and
rectum. No free fluid. Remaining bones and soft tissues are within
normal.
IMPRESSION: No acute findings in the abdomen/pelvis.

Sub cm hypodensity over the dome of the right lobe of the liver
likely a small cyst or hemangioma.

Subtle prominence of the upper pole collecting system of the right
kidney likely physiologic.

## 2017-06-11 IMAGING — CR DG ABDOMEN ACUTE W/ 1V CHEST
3 series · 3 of 3 positions shown · non-contrast
Comparison: None.

CLINICAL DATA: Acute left flank pain.

EXAM:
DG ABDOMEN ACUTE W/ 1V CHEST

[PA]
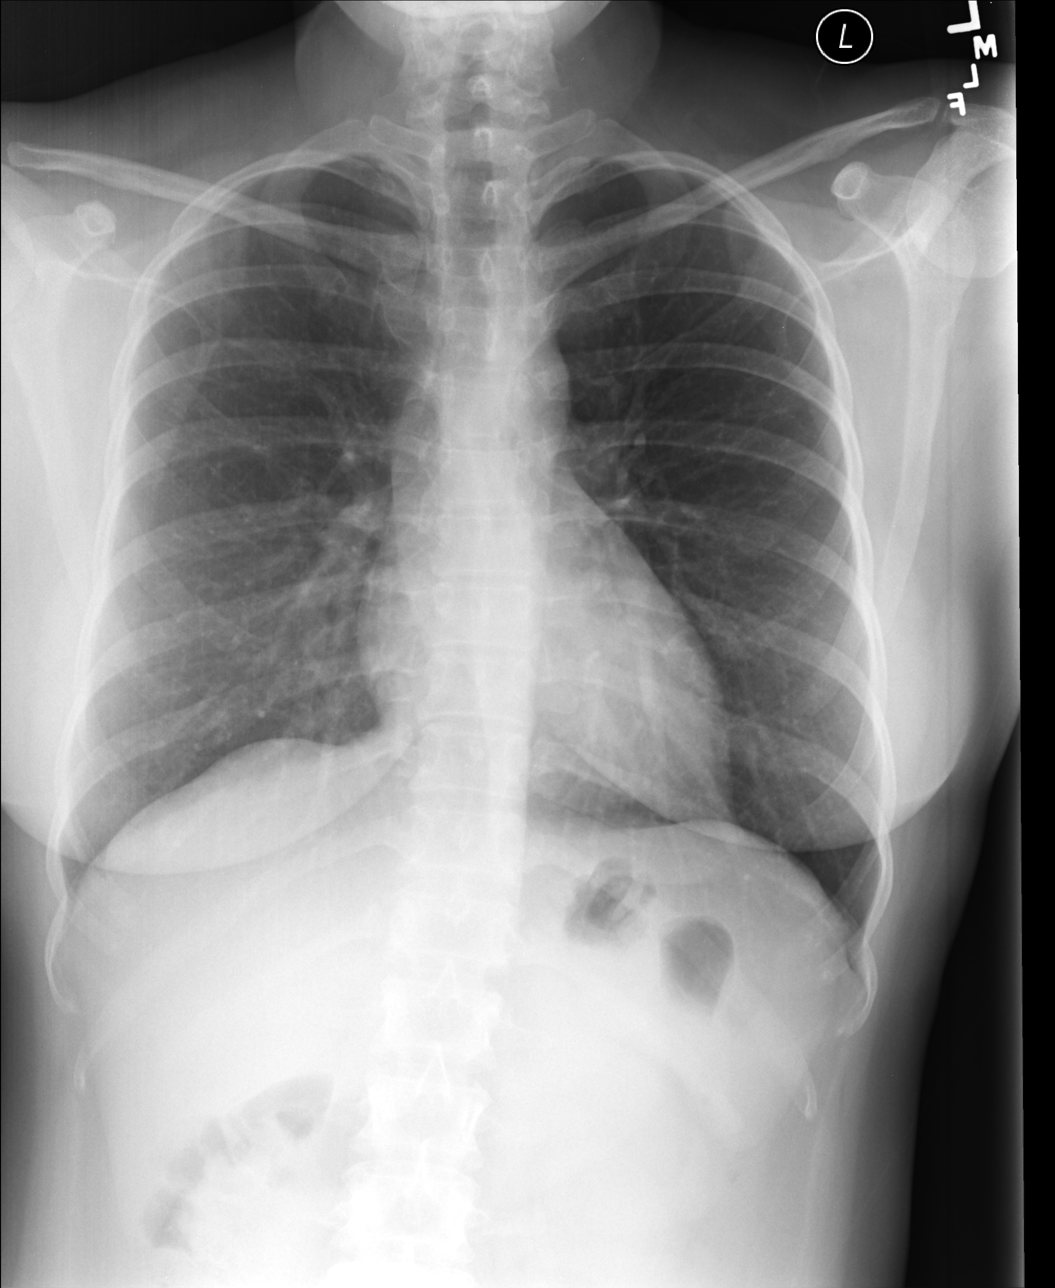

[AP (1 of 2)]
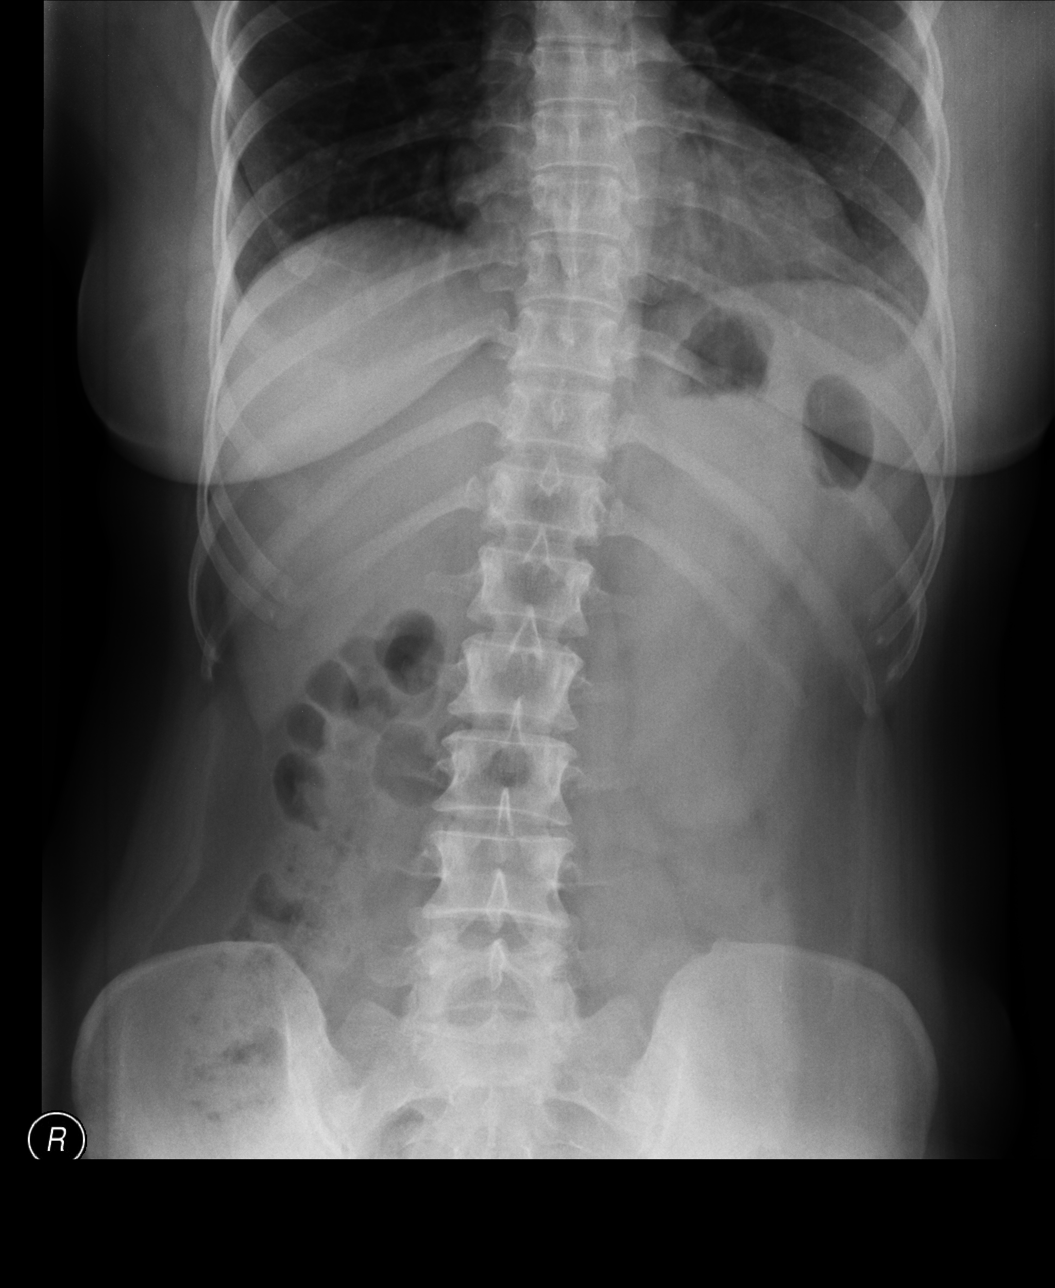

[AP (2 of 2)]
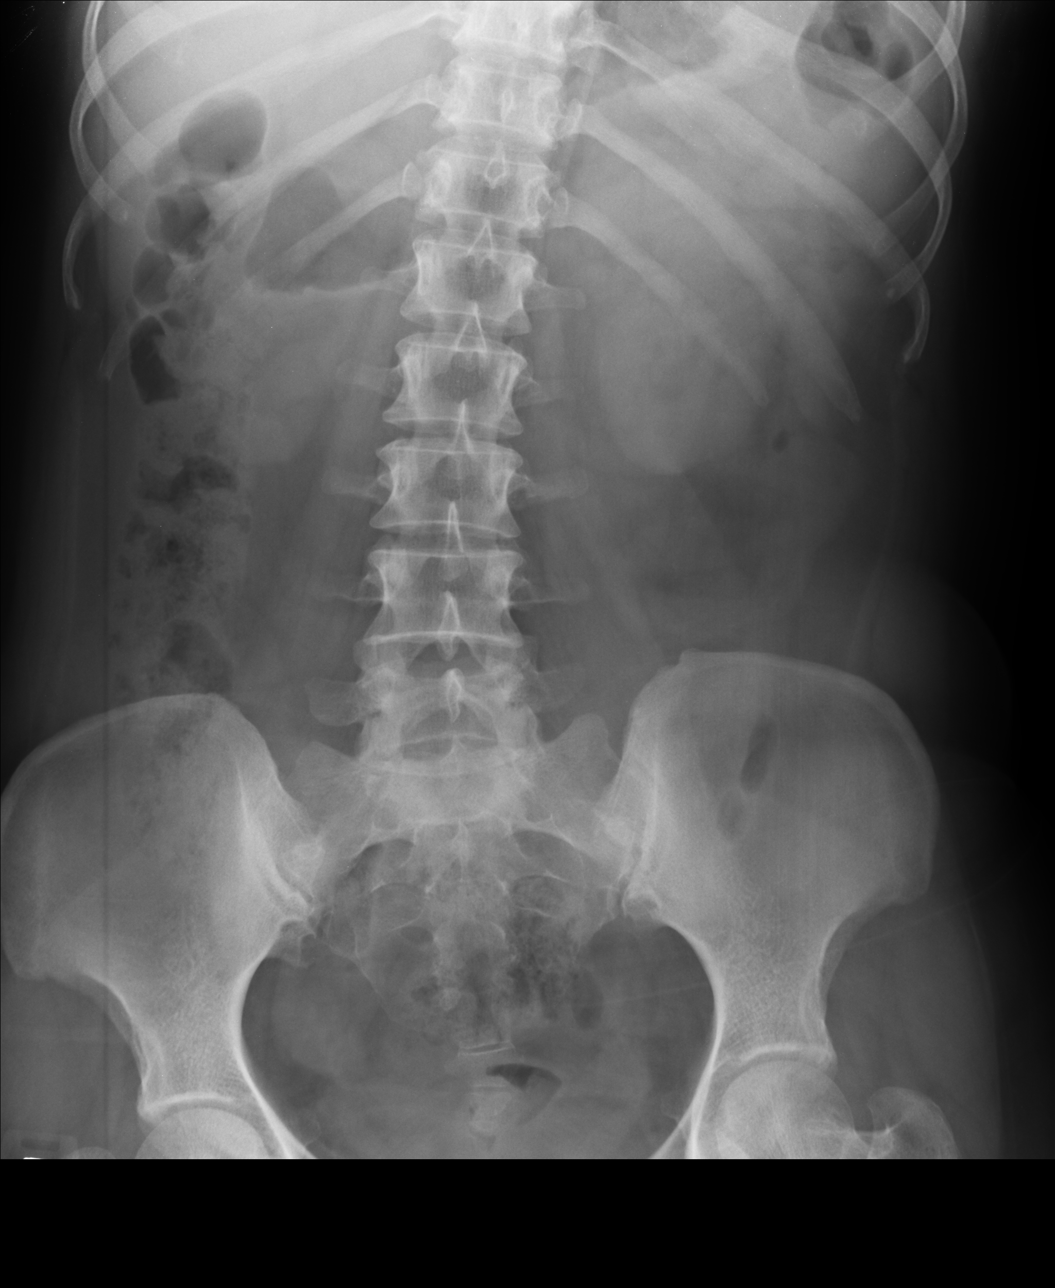

[3 of 3 positions shown; findings below may reference images not displayed]

FINDINGS: There is no evidence of dilated bowel loops or free intraperitoneal
air. No radiopaque calculi or other significant radiographic
abnormality is seen. Heart size and mediastinal contours are within
normal limits. Both lungs are clear.

Minimal generalized rightward scoliosis in the lumbar spine. No
acute bony abnormality.
IMPRESSION: Negative abdominal radiographs.  No acute cardiopulmonary disease.
